# Patient Record
Sex: Female | Born: 2000 | Race: Black or African American | Hispanic: No | Marital: Single | Smoking: Never smoker
Health system: Southern US, Community
[De-identification: ages and names within clinical notes are randomized; demographics above are authoritative.]

## PROBLEM LIST (undated history)

## (undated) DIAGNOSIS — J45909 Unspecified asthma, uncomplicated: Secondary | ICD-10-CM

---
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
## 2020-01-07 ENCOUNTER — Other Ambulatory Visit: Payer: Self-pay

## 2020-01-07 ENCOUNTER — Encounter (HOSPITAL_COMMUNITY): Payer: Self-pay | Admitting: Emergency Medicine

## 2020-01-07 ENCOUNTER — Ambulatory Visit (HOSPITAL_COMMUNITY)
Admission: EM | Admit: 2020-01-07 | Discharge: 2020-01-07 | Disposition: A | Discharge status: Home / Self Care | Payer: Medicaid Other

## 2020-01-07 DIAGNOSIS — J029 Acute pharyngitis, unspecified: Secondary | ICD-10-CM

## 2020-01-07 DIAGNOSIS — Z6981 Encounter for mental health services for victim of other abuse: Secondary | ICD-10-CM

## 2020-01-07 DIAGNOSIS — R221 Localized swelling, mass and lump, neck: Secondary | ICD-10-CM

## 2020-01-07 NOTE — Discharge Instructions (Addendum)
You have a nodule on your neck. I would recommend getting in with primary care and having an ultrasound.   Go to the Emergency Room for extreme tenderness, high fever, if nodule becomes larger, or other concerning symptoms.

## 2020-01-07 NOTE — ED Triage Notes (Signed)
Pt noticed a small moveable lump to the right side of her neck about two weeks ago.  Pt denies fever, ear pain or nasal congestion and denies any pain.
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
## 2020-03-09 ENCOUNTER — Ambulatory Visit (HOSPITAL_COMMUNITY)
Admission: EM | Admit: 2020-03-09 | Discharge: 2020-03-09 | Disposition: A | Discharge status: Home / Self Care | Payer: Medicaid Other | Attending: Family Medicine | Admitting: Family Medicine

## 2020-03-09 ENCOUNTER — Encounter (HOSPITAL_COMMUNITY): Payer: Self-pay

## 2020-03-09 ENCOUNTER — Other Ambulatory Visit: Payer: Self-pay

## 2020-03-09 DIAGNOSIS — Z79899 Other long term (current) drug therapy: Secondary | ICD-10-CM | POA: Diagnosis not present

## 2020-03-09 DIAGNOSIS — U071 COVID-19: Secondary | ICD-10-CM | POA: Insufficient documentation

## 2020-03-09 DIAGNOSIS — R05 Cough: Secondary | ICD-10-CM | POA: Insufficient documentation

## 2020-03-09 DIAGNOSIS — R059 Cough, unspecified: Secondary | ICD-10-CM

## 2020-03-09 DIAGNOSIS — Z87898 Personal history of other specified conditions: Secondary | ICD-10-CM | POA: Insufficient documentation

## 2020-03-09 DIAGNOSIS — M791 Myalgia, unspecified site: Secondary | ICD-10-CM | POA: Insufficient documentation

## 2020-03-09 DIAGNOSIS — R109 Unspecified abdominal pain: Secondary | ICD-10-CM | POA: Diagnosis not present

## 2020-03-09 DIAGNOSIS — J45909 Unspecified asthma, uncomplicated: Secondary | ICD-10-CM | POA: Insufficient documentation

## 2020-03-09 MED ORDER — ALBUTEROL SULFATE HFA 108 (90 BASE) MCG/ACT IN AERS: 1.0000 | INHALATION_SPRAY | Freq: Four times a day (QID) | RESPIRATORY_TRACT | 11 refills | Status: AC | PRN

## 2020-03-09 NOTE — ED Triage Notes (Signed)
Pt states she has body aches and cough x 1 week.

## 2020-03-09 NOTE — Discharge Instructions (Addendum)
Continue the Nyquil  Continue the inhalers (refilled today)  Strategies to prevent and/or treat COVID-19:  Vitamin D3 5000 IU (125 mcg) daily Vitamin C 500 mg twice daily Zinc 50 to 75 mg daily  Pepcid 20 mg twice a day  Listerine type mouthwash 4 times a day  Test results can be found online at MyChart (see below)

## 2020-03-09 NOTE — ED Provider Notes (Signed)
[facility]    CSN: [id number] Arrival date & time: 03/09/20  1000      History   Chief Complaint Chief Complaint  Patient presents with  . Generalized Body Aches  . Cough    HPI Angela Brandt is a 19 y.o. female.   Established [facility] patient  Pt states she has body aches and cough x 1 week.  Some migratory abdominal pains, no diarrhea.  Able to smell hand sanitizer but cannot differentiate flavors of Slurpees.  Has a h/o sinus infections and reactive airways.  Cough is productive.  Had sweats the first night, but sleeping well with NyQuil since.  No shortness of breath or vomiting.     Past Medical History:  Diagnosis Date  . Asthma     There are no problems to display for this patient.   History reviewed. No pertinent surgical history.  OB History   No obstetric history on file.      Home Medications    Prior to Admission medications   Medication Sig Start Date End Date Taking? Authorizing Provider  albuterol (VENTOLIN HFA) 108 (90 Base) MCG/ACT inhaler Inhale 1-2 puffs into the lungs every 6 (six) hours as needed for wheezing or shortness of breath. 03/09/20   Elvina Sidle, MD  cetirizine (ZYRTEC) 10 MG tablet Take 1 tablet (10 mg total) by mouth daily. Patient not taking: Reported on 03/09/2020 09/15/17 03/09/20  Georgetta Haber, NP  fluticasone (FLONASE) 50 MCG/ACT nasal spray Place 1 spray into both nostrils daily. Patient not taking: Reported on 03/09/2020 09/15/17 03/09/20  Georgetta Haber, NP    Family History Family History  Family history unknown: Yes    Social History Social History   Tobacco Use  . Smoking status: Never Smoker  . Smokeless tobacco: Never Used  Substance Use Topics  . Alcohol use: No  . Drug use: No     Allergies   Patient has no known allergies.   Review of Systems Review of Systems  Constitutional: Positive for diaphoresis and fatigue.  HENT: Positive for congestion.   Respiratory: Positive  for cough.   Musculoskeletal: Positive for myalgias.  All other systems reviewed and are negative.    Physical Exam Triage Vital Signs ED Triage Vitals  Enc Vitals Group     BP 03/09/20 1012 118/75     Pulse Rate 03/09/20 1012 85     Resp 03/09/20 1012 16     Temp 03/09/20 1012 98.6 F (37 C)     Temp src --      SpO2 03/09/20 1012 100 %     Weight 03/09/20 1010 109 lb (49.4 kg)     Height --      Head Circumference --      Peak Flow --      Pain Score 03/09/20 1010 0     Pain Loc --      Pain Edu? --      Excl. in GC? --    No data found.  Updated Vital Signs BP 118/75 (BP Location: Right Arm)   Pulse 85   Temp 98.6 F (37 C)   Resp 16   Wt 49.4 kg   LMP 02/25/2020   SpO2 100%    Physical Exam Vitals and nursing note reviewed.  Constitutional:      General: She is not in acute distress.    Appearance: Normal appearance. She is normal weight. She is not ill-appearing or toxic-appearing.  HENT:  Head: Normocephalic.     Mouth/Throat:     Pharynx: Oropharynx is clear.  Eyes:     Conjunctiva/sclera: Conjunctivae normal.  Cardiovascular:     Rate and Rhythm: Normal rate and regular rhythm.  Pulmonary:     Effort: Pulmonary effort is normal.     Breath sounds: Normal breath sounds.  Musculoskeletal:        General: Normal range of motion.     Cervical back: Normal range of motion and neck supple.  Skin:    General: Skin is warm and dry.  Neurological:     General: No focal deficit present.     Mental Status: She is alert and oriented to person, place, and time.  Psychiatric:        Mood and Affect: Mood normal.        Behavior: Behavior normal.        Thought Content: Thought content normal.        Judgment: Judgment normal.      UC Treatments / Results  Labs (all labs ordered are listed, but only abnormal results are displayed) Labs Reviewed  SARS CORONAVIRUS 2 (TAT 6-24 HRS)    EKG   Radiology No results  found.  Procedures Procedures (including critical care time)  Medications Ordered in UC Medications - No data to display  Initial Impression / Assessment and Plan / UC Course  I have reviewed the triage vital signs and the nursing notes.  Pertinent labs & imaging results that were available during my care of the patient were reviewed by me and considered in my medical decision making (see chart for details).    Final Clinical Impressions(s) / UC Diagnoses   Final diagnoses:  Cough  History of night sweats  Myalgia     Discharge Instructions     Continue the Nyquil  Continue the inhalers (refilled today)  Strategies to prevent and/or treat COVID-19:  Vitamin D3 5000 IU (125 mcg) daily Vitamin C 500 mg twice daily Zinc 50 to 75 mg daily  Pepcid 20 mg twice a day  Listerine type mouthwash 4 times a day  Test results can be found online at [facility] (see below)     ED Prescriptions    Medication Sig Dispense Auth. Provider   albuterol (VENTOLIN HFA) 108 (90 Base) MCG/ACT inhaler Inhale 1-2 puffs into the lungs every 6 (six) hours as needed for wheezing or shortness of breath. 18 g Robyn Haber, MD     I have reviewed the PDMP during this encounter.   Robyn Haber, MD 03/09/20 1029

## 2020-03-10 LAB — SARS CORONAVIRUS 2 (TAT 6-24 HRS): SARS Coronavirus 2: POSITIVE — AB

## 2023-04-14 ENCOUNTER — Ambulatory Visit: Payer: Self-pay
